# Patient Record
Sex: Male | Born: 1985 | Race: Black or African American | Hispanic: No | Marital: Married | Smoking: Never smoker
Health system: Southern US, Community
[De-identification: ages and names within clinical notes are randomized; demographics above are authoritative.]

## PROBLEM LIST (undated history)

## (undated) DIAGNOSIS — K219 Gastro-esophageal reflux disease without esophagitis: Secondary | ICD-10-CM

## (undated) DIAGNOSIS — F329 Major depressive disorder, single episode, unspecified: Secondary | ICD-10-CM

## (undated) DIAGNOSIS — M1990 Unspecified osteoarthritis, unspecified site: Secondary | ICD-10-CM

## (undated) DIAGNOSIS — F32A Depression, unspecified: Secondary | ICD-10-CM

## (undated) DIAGNOSIS — F419 Anxiety disorder, unspecified: Secondary | ICD-10-CM

## (undated) DIAGNOSIS — T7840XA Allergy, unspecified, initial encounter: Secondary | ICD-10-CM

## (undated) DIAGNOSIS — R195 Other fecal abnormalities: Secondary | ICD-10-CM

## (undated) HISTORY — DX: Gastro-esophageal reflux disease without esophagitis: K21.9

## (undated) HISTORY — PX: UPPER GASTROINTESTINAL ENDOSCOPY: SHX188

## (undated) HISTORY — DX: Other fecal abnormalities: R19.5

## (undated) HISTORY — DX: Depression, unspecified: F32.A

## (undated) HISTORY — PX: COLONOSCOPY: SHX174

## (undated) HISTORY — DX: Anxiety disorder, unspecified: F41.9

## (undated) HISTORY — DX: Unspecified osteoarthritis, unspecified site: M19.90

## (undated) HISTORY — PX: OTHER SURGICAL HISTORY: SHX169

## (undated) HISTORY — PX: WISDOM TOOTH EXTRACTION: SHX21

## (undated) HISTORY — DX: Allergy, unspecified, initial encounter: T78.40XA

---
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
## 2019-09-02 ENCOUNTER — Encounter (HOSPITAL_COMMUNITY): Payer: Self-pay

## 2019-09-02 ENCOUNTER — Emergency Department (HOSPITAL_COMMUNITY)
Admission: EM | Admit: 2019-09-02 | Discharge: 2019-09-03 | Discharge status: Against Medical Advice | Attending: Emergency Medicine | Admitting: Emergency Medicine

## 2019-09-02 DIAGNOSIS — K921 Melena: Secondary | ICD-10-CM | POA: Diagnosis present

## 2019-09-02 DIAGNOSIS — Z5321 Procedure and treatment not carried out due to patient leaving prior to being seen by health care provider: Secondary | ICD-10-CM | POA: Insufficient documentation

## 2019-09-02 LAB — CBC
HCT: 42.4 % (ref 39.0–52.0)
Hemoglobin: 15 g/dL (ref 13.0–17.0)
MCH: 31.3 pg (ref 26.0–34.0)
MCHC: 35.4 g/dL (ref 30.0–36.0)
MCV: 88.5 fL (ref 80.0–100.0)
Platelets: 214 10*3/uL (ref 150–400)
RBC: 4.79 MIL/uL (ref 4.22–5.81)
RDW: 12.5 % (ref 11.5–15.5)
WBC: 10.4 10*3/uL (ref 4.0–10.5)
nRBC: 0 % (ref 0.0–0.2)

## 2019-09-02 LAB — COMPREHENSIVE METABOLIC PANEL
ALT: 33 U/L (ref 0–44)
AST: 19 U/L (ref 15–41)
Albumin: 3.9 g/dL (ref 3.5–5.0)
Alkaline Phosphatase: 56 U/L (ref 38–126)
Anion gap: 11 (ref 5–15)
BUN: 25 mg/dL — ABNORMAL HIGH (ref 6–20)
CO2: 23 mmol/L (ref 22–32)
Calcium: 9.2 mg/dL (ref 8.9–10.3)
Chloride: 103 mmol/L (ref 98–111)
Creatinine, Ser: 1.31 mg/dL — ABNORMAL HIGH (ref 0.61–1.24)
GFR calc Af Amer: >60 mL/min (ref >60)
GFR calc non Af Amer: >60 mL/min (ref >60)
Glucose, Bld: 147 mg/dL — ABNORMAL HIGH (ref 70–99)
Potassium: 3.9 mmol/L (ref 3.5–5.1)
Sodium: 137 mmol/L (ref 135–145)
Total Bilirubin: 0.6 mg/dL (ref 0.3–1.2)
Total Protein: 6.3 g/dL — ABNORMAL LOW (ref 6.5–8.1)

## 2019-09-02 LAB — TYPE AND SCREEN
ABO/RH(D): O POS
Antibody Screen: NEGATIVE

## 2019-09-02 NOTE — ED Triage Notes (Signed)
Pt states that he has been having black stool for the past few days, denies abd pain.

## 2019-09-03 ENCOUNTER — Ambulatory Visit (INDEPENDENT_AMBULATORY_CARE_PROVIDER_SITE_OTHER)
Admission: EM | Admit: 2019-09-03 | Discharge: 2019-09-03 | Disposition: A | Discharge status: Home / Self Care | Source: Home / Self Care

## 2019-09-03 ENCOUNTER — Other Ambulatory Visit: Payer: Self-pay

## 2019-09-03 ENCOUNTER — Encounter (HOSPITAL_COMMUNITY): Payer: Self-pay | Admitting: Emergency Medicine

## 2019-09-03 DIAGNOSIS — R195 Other fecal abnormalities: Secondary | ICD-10-CM | POA: Diagnosis not present

## 2019-09-03 DIAGNOSIS — R1013 Epigastric pain: Secondary | ICD-10-CM

## 2019-09-03 LAB — POC OCCULT BLOOD, ED: Fecal Occult Bld: POSITIVE — AB

## 2019-09-03 LAB — ABO/RH: ABO/RH(D): O POS

## 2019-09-03 LAB — OCCULT BLOOD, POC DEVICE: Fecal Occult Bld: POSITIVE — AB

## 2019-09-03 MED ORDER — OMEPRAZOLE 20 MG PO CPDR: 20.0000 mg | DELAYED_RELEASE_CAPSULE | Freq: Two times a day (BID) | ORAL | 0 refills | Status: DC

## 2019-09-03 NOTE — ED Triage Notes (Signed)
Pt here for balck stools onset 3 days associated w/abd pain, decreased appetite.   Also c/o headache onset 1 week ago  Denies fevers, blood in stool , diarrhea  Saw PCP yesterday for headache and was given Meloxicam  A&O x4... nAD.Marland Kitchen. ambulatory

## 2019-09-03 NOTE — ED Notes (Signed)
No response in waiting area. 

## 2019-09-03 NOTE — Discharge Instructions (Signed)
Your stool was positive for blood.  Based on that and your upper abdominal discomfort there is some concern today for peptic ulcer.  We will start you on omeprazole 20 mg twice a day and have you follow-up with GI specialist for possible upper endoscopy.  If your symptoms worsen or you start vomiting blood you will need to go straight to the hospital. Otherwise follow-up with GI.  You will need a primary care. Please avoid all NSAIDs to include ibuprofen, meloxicam, Aleve Please avoid alcohol for now this could be irritating to the stomach You can take Tylenol for your headaches Eat small meals and avoid things that are spicy, greasy.  Try to decrease your caffeine

## 2019-09-03 NOTE — ED Provider Notes (Signed)
[facility]    CSN: [id number] Arrival date & time: 09/03/19  1136      History   Chief Complaint Chief Complaint  Patient presents with  . Melena    HPI Joe Mcgee is a 33 y.o. male.   Patient is a 33 year old male the presents today with 3 days of black stools associated with some upper abdominal discomfort and decreased appetite.  Symptoms have been constant, waxing and  waning.  Rating pain is a 4/10 now.  Describes the pain as gnawing and burning at times.  He has not taken anything for his symptoms.  He is also reporting headache for the past week and was prescribed meloxicam for this which he is not taking.  He has associated the headache with not eating.  Denies any fever, chills, body aches, diarrhea, constipation or rectal pain.  Denies any bright red rectal bleeding or hematemesis.  Admits to drinking liquor almost daily.  He has not had any increase in NSAID use.  He does not smoke.   Side note patient was seen in the ER yesterday and left due to wait time.  He had labs drawn at that time.  ROS per HPI      History reviewed. No pertinent past medical history.  There are no problems to display for this patient.   History reviewed. No pertinent surgical history.     Home Medications    Prior to Admission medications   Medication Sig Start Date End Date Taking? Authorizing Provider  omeprazole (PRILOSEC) 20 MG capsule Take 1 capsule (20 mg total) by mouth 2 (two) times daily before a meal. 09/03/19 10/03/19  Janace Aris, NP    Family History History reviewed. No pertinent family history.  Social History Social History   Tobacco Use  . Smoking status: Never Smoker  . Smokeless tobacco: Never Used  Substance Use Topics  . Alcohol use: Yes  . Drug use: Never     Allergies   Patient has no known allergies.   Review of Systems Review of Systems   Physical Exam Triage Vital Signs ED Triage Vitals  Enc Vitals Group   BP 09/03/19 1153 114/75     Pulse Rate 09/03/19 1153 (!) 102     Resp 09/03/19 1153 18     Temp 09/03/19 1153 98.8 F (37.1 C)     Temp Source 09/03/19 1153 Oral     SpO2 09/03/19 1153 96 %     Weight --      Height --      Head Circumference --      Peak Flow --      Pain Score 09/03/19 1154 4     Pain Loc --      Pain Edu? --      Excl. in GC? --    No data found.  Updated Vital Signs BP 114/75 (BP Location: Right Arm)   Pulse (!) 102   Temp 98.8 F (37.1 C) (Oral)   Resp 18   SpO2 96%   Visual Acuity Right Eye Distance:   Left Eye Distance:   Bilateral Distance:    Right Eye Near:   Left Eye Near:    Bilateral Near:     Physical Exam Vitals and nursing note reviewed.  Constitutional:      General: He is not in acute distress.    Appearance: Normal appearance. He is not ill-appearing, toxic-appearing or diaphoretic.  HENT:     Head:  Normocephalic and atraumatic.     Nose: Nose normal.  Eyes:     Conjunctiva/sclera: Conjunctivae normal.  Abdominal:     General: Abdomen is flat. Bowel sounds are normal.     Palpations: Abdomen is soft.     Tenderness: There is abdominal tenderness in the epigastric area. There is no right CVA tenderness, left CVA tenderness, guarding or rebound.    Musculoskeletal:        General: Normal range of motion.  Skin:    General: Skin is warm and dry.  Neurological:     Mental Status: He is alert.  Psychiatric:        Mood and Affect: Mood normal.      UC Treatments / Results  Labs (all labs ordered are listed, but only abnormal results are displayed) Labs Reviewed  POC OCCULT BLOOD, ED - Abnormal; Notable for the following components:      Result Value   Fecal Occult Bld POSITIVE (*)    All other components within normal limits  OCCULT BLOOD, POC DEVICE - Abnormal; Notable for the following components:   Fecal Occult Bld POSITIVE (*)    All other components within normal limits    EKG   Radiology No results  found.  Procedures Procedures (including critical care time)  Medications Ordered in UC Medications - No data to display  Initial Impression / Assessment and Plan / UC Course  I have reviewed the triage vital signs and the nursing notes.  Pertinent labs & imaging results that were available during my care of the patient were reviewed by me and considered in my medical decision making (see chart for details).     Epigastric discomfort with dark stools-based on symptoms, exam and positive Hemoccult most likely patient symptoms are related to PUD.  He is otherwise hemodynamically stable according to vital signs and normal hemoglobin that was drawn at the ER yesterday. He is not having any dizziness and pain is currently minimal. We will start him on PPI and have him follow-up with GI specialist Strict return precautions that if symptoms worsen he will need to go to the ER. Pt understanding and agreed.  Final Clinical Impressions(s) / UC Diagnoses   Final diagnoses:  Dark stools  Epigastric abdominal pain     Discharge Instructions     Your stool was positive for blood.  Based on that and your upper abdominal discomfort there is some concern today for peptic ulcer.  We will start you on omeprazole 20 mg twice a day and have you follow-up with GI specialist for possible upper endoscopy.  If your symptoms worsen or you start vomiting blood you will need to go straight to the hospital. Otherwise follow-up with GI.  You will need a primary care. Please avoid all NSAIDs to include ibuprofen, meloxicam, Aleve Please avoid alcohol for now this could be irritating to the stomach You can take Tylenol for your headaches Eat small meals and avoid things that are spicy, greasy.  Try to decrease your caffeine     ED Prescriptions    Medication Sig Dispense Auth. Provider   omeprazole (PRILOSEC) 20 MG capsule Take 1 capsule (20 mg total) by mouth 2 (two) times daily before a meal. 60 capsule  Syretta Kochel A, NP     PDMP not reviewed this encounter.   Orvan July, NP 09/03/19 1318

## 2019-09-03 NOTE — ED Notes (Signed)
Called to take to treatment room, no response, not found in lobby area.

## 2019-09-09 ENCOUNTER — Ambulatory Visit (INDEPENDENT_AMBULATORY_CARE_PROVIDER_SITE_OTHER): Admitting: Physician Assistant

## 2019-09-09 ENCOUNTER — Other Ambulatory Visit (INDEPENDENT_AMBULATORY_CARE_PROVIDER_SITE_OTHER)

## 2019-09-09 ENCOUNTER — Encounter: Payer: Self-pay | Admitting: Physician Assistant

## 2019-09-09 VITALS — BP 118/60 | HR 97 | Temp 97.4°F | Ht 72.0 in | Wt 222.0 lb

## 2019-09-09 DIAGNOSIS — K921 Melena: Secondary | ICD-10-CM

## 2019-09-09 DIAGNOSIS — K5900 Constipation, unspecified: Secondary | ICD-10-CM | POA: Diagnosis not present

## 2019-09-09 DIAGNOSIS — R140 Abdominal distension (gaseous): Secondary | ICD-10-CM

## 2019-09-09 DIAGNOSIS — R630 Anorexia: Secondary | ICD-10-CM | POA: Diagnosis not present

## 2019-09-09 DIAGNOSIS — R195 Other fecal abnormalities: Secondary | ICD-10-CM

## 2019-09-09 LAB — BASIC METABOLIC PANEL
BUN: 13 mg/dL (ref 6–23)
CO2: 29 mEq/L (ref 19–32)
Calcium: 9 mg/dL (ref 8.4–10.5)
Chloride: 102 mEq/L (ref 96–112)
Creatinine, Ser: 1.26 mg/dL (ref 0.40–1.50)
GFR: 79.84 mL/min (ref >60.00)
Glucose, Bld: 95 mg/dL (ref 70–99)
Potassium: 3.9 mEq/L (ref 3.5–5.1)
Sodium: 137 mEq/L (ref 135–145)

## 2019-09-09 MED ORDER — OMEPRAZOLE 20 MG PO CPDR: 20.0000 mg | DELAYED_RELEASE_CAPSULE | Freq: Two times a day (BID) | ORAL | 0 refills | Status: DC

## 2019-09-09 NOTE — Progress Notes (Signed)
Subjective:    Patient ID: Joe Mcgee, male    DOB: 01-28-1986, 33 y.o.   MRN: [id number]  HPI Joe Mcgee is a pleasant 33 year old African-American male, new to GI today referred after recent urgent care visit on 09/03/2019.  He presented there with complaints of black stools x3 days.  He also had a decrease in appetite recently and says he feels bloated and uncomfortable generally in his abdomen and has had some recent constipation. Stool was Hemoccult it there and positive.  He was started on omeprazole 20 mg p.o. twice daily.  Labs with CBC showed WBC of 10.4, hemoglobin 15 hematocrit of 42.4, BUN 25/creatinine 1.3 and LFTs within normal limits.  Patient denies any regular aspirin or NSAID use, he does drink occasionally a couple of times per week but not on a daily basis. He has not had any prior GI issues. He continues to complain of abdominal discomfort some bloating and fullness today.  He has not noted any further dark or black stools since Monday and denies any bright red blood.  He still feels somewhat constipated. No recent abdominal imaging Family history negative for GI disease as far as he is aware  Review of Systems Pertinent positive and negative review of systems were noted in the above HPI section.  All other review of systems was otherwise negative.  Outpatient Encounter Medications as of 09/09/2019  Medication Sig  . omeprazole (PRILOSEC) 20 MG capsule Take 1 capsule (20 mg total) by mouth 2 (two) times daily before a meal.  . [DISCONTINUED] omeprazole (PRILOSEC) 20 MG capsule Take 1 capsule (20 mg total) by mouth 2 (two) times daily before a meal.   No facility-administered encounter medications on file as of 09/09/2019.   No Known Allergies There are no problems to display for this patient.  Social History   Socioeconomic History  . Marital status: Married    Spouse name: Not on file  . Number of children: 4  . Years of education: Not on file  . Highest  education level: Not on file  Occupational History  . Occupation: [employer]  Tobacco Use  . Smoking status: Never Smoker  . Smokeless tobacco: Never Used  Substance and Sexual Activity  . Alcohol use: Yes    Comment: socially   . Drug use: Never  . Sexual activity: Not on file  Other Topics Concern  . Not on file  Social History Narrative  . Not on file   Social Determinants of Health   Financial Resource Strain:   . Difficulty of Paying Living Expenses: Not on file  Food Insecurity:   . Worried About Charity fundraiser in the Last Year: Not on file  . Ran Out of Food in the Last Year: Not on file  Transportation Needs:   . Lack of Transportation (Medical): Not on file  . Lack of Transportation (Non-Medical): Not on file  Physical Activity:   . Days of Exercise per Week: Not on file  . Minutes of Exercise per Session: Not on file  Stress:   . Feeling of Stress : Not on file  Social Connections:   . Frequency of Communication with Friends and Family: Not on file  . Frequency of Social Gatherings with Friends and Family: Not on file  . Attends Religious Services: Not on file  . Active Member of Clubs or Organizations: Not on file  . Attends Archivist Meetings: Not on file  . Marital Status: Not on file  Intimate Partner Violence:   . Fear of Current or Ex-Partner: Not on file  . Emotionally Abused: Not on file  . Physically Abused: Not on file  . Sexually Abused: Not on file    Joe Mcgee family history includes Hyperlipidemia in his father and mother; Hypertension in his father and mother.      Objective:    Vitals:   09/09/19 1443  BP: 118/60  Pulse: 97  Temp: (!) 97.4 F (36.3 C)    Physical Exam Well-developed well-nourished African-American male in no acute distress.   Weight 222, BMI 30.1  HEENT; nontraumatic normocephalic, EOMI, PER R LA, sclera anicteric. Oropharynx; not examined/mask/Covid Neck; supple, no JVD Cardiovascular; regular  rate and rhythm with S1-S2, no murmur rub or gallop Pulmonary; Clear bilaterally Abdomen; soft, he is tender in the right mid right lower quadrant no guarding no palpable mass or hepatosplenomegaly,, bowel sounds present bowel sounds are active Rectal; not done today recently documented heme positive Skin; benign exam, no jaundice rash or appreciable lesions Extremities; no clubbing cyanosis or edema skin warm and dry Neuro/Psych; alert and oriented x4, grossly nonfocal mood and affect appropriate       Assessment & Plan:   #17 33 year old African-American male with recent 3-day episode of black stool, documented heme positive at urgent care with normal hemoglobin at 15. Patient has been having abdominal discomfort/fullness over the past few weeks and is tender more in the right mid and right lower quadrant.  Also with recent constipation. Etiology of symptoms is not entirely clear, all of his symptoms do not fit well with gastritis or peptic ulcer disease. 2.  Mildly elevated creatinine  Plan; Continue omeprazole 20 mg p.o. twice daily Start MiraLAX 17 g in 8 ounces of water every day Push fluid intake. Patient will be scheduled for CT scan of the abdomen and pelvis with contrast within the next couple of days. If CT is unrevealing he will need Endoscopy with Dr. Marina Goodell.   [name] PA-C 09/09/2019   Cc: Janace Aris, NP

## 2019-09-09 NOTE — Patient Instructions (Signed)
We have sent the following medications to your pharmacy for you to pick up at your convenience: Omeprazole 20 mg twice a day   You have been scheduled for a CT scan of the abdomen and pelvis at [facility] ([address]---this is in the same building as [employer]).   You are scheduled on 09-15-19 at 10:30 am. You should arrive 15 minutes prior to your appointment time for registration. Please follow the written instructions below on the day of your exam:  WARNING: IF YOU ARE ALLERGIC TO IODINE/X-RAY DYE, PLEASE NOTIFY RADIOLOGY IMMEDIATELY AT [phone]! YOU WILL BE GIVEN A 13 HOUR PREMEDICATION PREP.  1) Do not eat or drink anything after 6:30 am (4 hours prior to your test) 2) You have been given 2 bottles of oral contrast to drink. The solution may taste better if refrigerated, but do NOT add ice or any other liquid to this solution. Shake well before drinking.    Drink 1 bottle of contrast @ 8:30 am (2 hours prior to your exam)  Drink 1 bottle of contrast @ 9:30 am (1 hour prior to your exam)  You may take any medications as prescribed with a small amount of water, if necessary. If you take any of the following medications: METFORMIN, GLUCOPHAGE, GLUCOVANCE, AVANDAMET, RIOMET, FORTAMET, [place] MET, JANUMET, GLUMETZA or METAGLIP, you MAY be asked to HOLD this medication 48 hours AFTER the exam.  The purpose of you drinking the oral contrast is to aid in the visualization of your intestinal tract. The contrast solution may cause some diarrhea. Depending on your individual set of symptoms, you may also receive an intravenous injection of x-ray contrast/dye. Plan on being at [facility] for 30 minutes or longer, depending on the type of exam you are having performed.  This test typically takes 30-45 minutes to complete.  If you have any questions regarding your exam or if you need to reschedule, you may call the CT department at [phone] between the  hours of 8:00 am and 5:00 pm, Monday-Friday.  ________________________________________________________________________

## 2019-09-10 NOTE — Progress Notes (Signed)
Assessment and plan reviewed 

## 2019-09-13 NOTE — Progress Notes (Signed)
Please let pt know repeat labs show kidney function normal

## 2019-09-15 ENCOUNTER — Other Ambulatory Visit: Payer: Self-pay

## 2019-09-15 ENCOUNTER — Ambulatory Visit (INDEPENDENT_AMBULATORY_CARE_PROVIDER_SITE_OTHER)
Admission: RE | Admit: 2019-09-15 | Discharge: 2019-09-15 | Disposition: A | Discharge status: Home / Self Care | Source: Ambulatory Visit | Attending: Physician Assistant | Admitting: Physician Assistant

## 2019-09-15 DIAGNOSIS — R140 Abdominal distension (gaseous): Secondary | ICD-10-CM

## 2019-09-15 DIAGNOSIS — K921 Melena: Secondary | ICD-10-CM

## 2019-09-15 MED ORDER — IOHEXOL 300 MG/ML  SOLN
100.0000 mL | Freq: Once | INTRAMUSCULAR | Status: AC | PRN
  Administered 2019-09-15: 100 mL via INTRAVENOUS

## 2019-09-21 NOTE — Progress Notes (Signed)
Yes , ok to proceed with EGD

## 2019-10-11 ENCOUNTER — Encounter

## 2019-10-11 ENCOUNTER — Other Ambulatory Visit: Payer: Self-pay

## 2019-10-11 ENCOUNTER — Ambulatory Visit (AMBULATORY_SURGERY_CENTER): Admitting: *Deleted

## 2019-10-11 VITALS — Ht 72.0 in | Wt 217.0 lb

## 2019-10-11 DIAGNOSIS — R195 Other fecal abnormalities: Secondary | ICD-10-CM

## 2019-10-11 DIAGNOSIS — R140 Abdominal distension (gaseous): Secondary | ICD-10-CM

## 2019-10-11 DIAGNOSIS — Z01818 Encounter for other preprocedural examination: Secondary | ICD-10-CM

## 2019-10-11 NOTE — Progress Notes (Signed)
No egg or soy allergy known to patient  No issues with past sedation with any surgeries  or procedures, no intubation problems  No diet pills per patient No home 02 use per patient  No blood thinners per patient  Pt denies issues with constipation  No A fib or A flutter  EMMI video sent to pt's e mail     Due to the COVID-19 pandemic we are asking patients to follow these guidelines. Please only bring one care partner. Please be aware that your care partner may wait in the car in the parking lot or if they feel like they will be too hot to wait in the car, they may wait in the lobby on the 4th floor. All care partners are required to wear a mask the entire time (we do not have any that we can provide them), they need to practice social distancing, and we will do a Covid check for all patient's and care partners when you arrive. Also we will check their temperature and your temperature. If the care partner waits in their car they need to stay in the parking lot the entire time and we will call them on their cell phone when the patient is ready for discharge so they can bring the car to the front of the building. Also all patient's will need to wear a mask into building.  Pt verified name, DOB, address and insurance during PV today. Pt e-mailed instruction packet , Emmi video, and reasons to delay/cancel . PV completed over the phone. Pt encouraged to call with questions or issues - pt aware he wil;l sign the consent form and the pre procedure acknow. Form Friday 1-22 in admitting-

## 2019-10-12 ENCOUNTER — Ambulatory Visit (INDEPENDENT_AMBULATORY_CARE_PROVIDER_SITE_OTHER)

## 2019-10-12 DIAGNOSIS — Z1159 Encounter for screening for other viral diseases: Secondary | ICD-10-CM

## 2019-10-13 LAB — SARS CORONAVIRUS 2 (TAT 6-24 HRS): SARS Coronavirus 2: NEGATIVE

## 2019-10-15 ENCOUNTER — Other Ambulatory Visit: Payer: Self-pay

## 2019-10-15 ENCOUNTER — Ambulatory Visit (AMBULATORY_SURGERY_CENTER): Admitting: Internal Medicine

## 2019-10-15 ENCOUNTER — Encounter: Payer: Self-pay | Admitting: Internal Medicine

## 2019-10-15 VITALS — BP 118/76 | HR 79 | Temp 98.6°F | Resp 18 | Ht 72.0 in | Wt 222.0 lb

## 2019-10-15 DIAGNOSIS — K222 Esophageal obstruction: Secondary | ICD-10-CM

## 2019-10-15 DIAGNOSIS — K5900 Constipation, unspecified: Secondary | ICD-10-CM

## 2019-10-15 DIAGNOSIS — K449 Diaphragmatic hernia without obstruction or gangrene: Secondary | ICD-10-CM

## 2019-10-15 DIAGNOSIS — K2100 Gastro-esophageal reflux disease with esophagitis, without bleeding: Secondary | ICD-10-CM

## 2019-10-15 DIAGNOSIS — R140 Abdominal distension (gaseous): Secondary | ICD-10-CM

## 2019-10-15 DIAGNOSIS — R195 Other fecal abnormalities: Secondary | ICD-10-CM

## 2019-10-15 MED ORDER — PANTOPRAZOLE SODIUM 40 MG PO TBEC: 40.0000 mg | DELAYED_RELEASE_TABLET | Freq: Every day | ORAL | 11 refills | Status: DC

## 2019-10-15 MED ORDER — SODIUM CHLORIDE 0.9 % IV SOLN: 500.0000 mL | Freq: Once | INTRAVENOUS | Status: DC

## 2019-10-15 NOTE — Op Note (Signed)
[facility] Patient Name: Joe Mcgee Procedure Date: 10/15/2019 3:18 PM MRN: [id number] Endoscopist: Wilhemina Bonito. Marina Goodell , MD Age: 34 Referring MD:  Date of Birth: 27-May-1986 Gender: Male Account #: [id number] Procedure:                Upper GI endoscopy Indications:              Heme positive stool, dark stools, bloating,                            abdominal pain Medicines:                Monitored Anesthesia Care Procedure:                Pre-Anesthesia Assessment:                           - Prior to the procedure, a History and Physical                            was performed, and patient medications and                            allergies were reviewed. The patient's tolerance of                            previous anesthesia was also reviewed. The risks                            and benefits of the procedure and the sedation                            options and risks were discussed with the patient.                            All questions were answered, and informed consent                            was obtained. Prior Anticoagulants: The patient has                            taken no previous anticoagulant or antiplatelet                            agents. ASA Grade Assessment: I - A normal, healthy                            patient. After reviewing the risks and benefits,                            the patient was deemed in satisfactory condition to                            undergo the procedure.  After obtaining informed consent, the endoscope was                            passed under direct vision. Throughout the                            procedure, the patient's blood pressure, pulse, and                            oxygen saturations were monitored continuously. The                            Endoscope was introduced through the mouth, and                            advanced to the second part of duodenum. The upper                  GI endoscopy was accomplished without difficulty.                            The patient tolerated the procedure well. Scope In: Scope Out: Findings:                 The esophagus revealed erosive esophagitis with                            ulceration and stricturing (35 cm from the                            incisors).                           The stomach was normal except for a moderate sized                            sliding hiatal hernia.                           The examined duodenum was normal.                           The cardia and gastric fundus were normal on                            retroflexion. Complications:            No immediate complications. Estimated Blood Loss:     Estimated blood loss: none. Impression:               1. GERD with erosive esophagitis                           2. Esophageal stricture                           3. Hiatal hernia  4. Otherwise normal EGD. Recommendation:           1. Prescribe pantoprazole 40 mg daily; #30; 11                            refills                           2. Reflux precautions                           3. Schedule colonoscopy in the [facility] "evaluate change                            in bowel habits, lower abdominal pain, heme                            positive stool". Wilhemina Bonito. Marina Goodell, MD 10/15/2019 3:40:10 PM This report has been signed electronically.

## 2019-10-15 NOTE — Patient Instructions (Signed)
YOU HAD AN ENDOSCOPIC PROCEDURE TODAY AT THE [facility]:   Refer to the procedure report that was given to you for any specific questions about what was found during the examination.  If the procedure report does not answer your questions, please call your gastroenterologist to clarify.  If you requested that your care partner not be given the details of your procedure findings, then the procedure report has been included in a sealed envelope for you to review at your convenience later.  YOU SHOULD EXPECT: Some feelings of bloating in the abdomen. Passage of more gas than usual.  Walking can help get rid of the air that was put into your GI tract during the procedure and reduce the bloating. If you had a lower endoscopy (such as a colonoscopy or flexible sigmoidoscopy) you may notice spotting of blood in your stool or on the toilet paper. If you underwent a bowel prep for your procedure, you may not have a normal bowel movement for a few days.  Please Note:  You might notice some irritation and congestion in your nose or some drainage.  This is from the oxygen used during your procedure.  There is no need for concern and it should clear up in a day or so.  SYMPTOMS TO REPORT IMMEDIATELY:   Following upper endoscopy (EGD)  Vomiting of blood or coffee ground material  New chest pain or pain under the shoulder blades  Painful or persistently difficult swallowing  New shortness of breath  Fever of 100F or higher  Black, tarry-looking stools  For urgent or emergent issues, a gastroenterologist can be reached at any hour by calling [phone].   DIET:  We do recommend a small meal at first, but then you may proceed to your regular diet.  Drink plenty of fluids but you should avoid alcoholic beverages for 24 hours.  ACTIVITY:  You should plan to take it easy for the rest of today and you should NOT DRIVE or use heavy machinery until tomorrow (because of the sedation medicines used  during the test).    FOLLOW UP: Our staff will call the number listed on your records 48-72 hours following your procedure to check on you and address any questions or concerns that you may have regarding the information given to you following your procedure. If we do not reach you, we will leave a message.  We will attempt to reach you two times.  During this call, we will ask if you have developed any symptoms of COVID 19. If you develop any symptoms (ie: fever, flu-like symptoms, shortness of breath, cough etc.) before then, please call [phone].  If you test positive for Covid 19 in the 2 weeks post procedure, please call and report this information to us.    If any biopsies were taken you will be contacted by phone or by letter within the next 1-3 weeks.  Please call us at [phone] if you have not heard about the biopsies in 3 weeks.    SIGNATURES/CONFIDENTIALITY: You and/or your care partner have signed paperwork which will be entered into your electronic medical record.  These signatures attest to the fact that that the information above on your After Visit Summary has been reviewed and is understood.  Full responsibility of the confidentiality of this discharge information lies with you and/or your care-partner. 

## 2019-10-15 NOTE — Progress Notes (Signed)
Temp JB VS DT  Pt's states no medical or surgical changes since previsit or office visit. 

## 2019-10-15 NOTE — Progress Notes (Signed)
PT taken to PACU. Monitors in place. VSS. Report given to RN. 

## 2019-10-19 ENCOUNTER — Telehealth: Payer: Self-pay

## 2019-10-19 NOTE — Telephone Encounter (Signed)
  Follow up Call-  Call back number 10/15/2019  Post procedure Call Back phone  # [phone]  Permission to leave phone message Yes     Patient questions:  Do you have a fever, pain , or abdominal swelling? No. Pain Score  0 *  Have you tolerated food without any problems? Yes.    Have you been able to return to your normal activities? Yes.    Do you have any questions about your discharge instructions: Diet   No. Medications  No. Follow up visit  No.  Do you have questions or concerns about your Care? No.  Actions: * If pain score is 4 or above: No action needed, pain <4.    1. Have you developed a fever since your procedure? No  2.   Have you had an respiratory symptoms (SOB or cough) since your procedure? No  3.   Have you tested positive for COVID 19 since your procedure? No  4.   Have you had any family members/close contacts diagnosed with the COVID 19 since your procedure?  No   If yes to any of these questions please route to Laverna Peace, RN and Jennye Boroughs, RN.

## 2019-10-26 ENCOUNTER — Other Ambulatory Visit: Payer: Self-pay

## 2019-10-26 ENCOUNTER — Ambulatory Visit (AMBULATORY_SURGERY_CENTER): Payer: Self-pay | Admitting: *Deleted

## 2019-10-26 VITALS — Temp 96.9°F | Ht 72.0 in | Wt 230.0 lb

## 2019-10-26 DIAGNOSIS — R195 Other fecal abnormalities: Secondary | ICD-10-CM

## 2019-10-26 DIAGNOSIS — R194 Change in bowel habit: Secondary | ICD-10-CM

## 2019-10-26 DIAGNOSIS — Z01818 Encounter for other preprocedural examination: Secondary | ICD-10-CM

## 2019-10-26 MED ORDER — NA SULFATE-K SULFATE-MG SULF 17.5-3.13-1.6 GM/177ML PO SOLN: ORAL | 0 refills | Status: DC

## 2019-10-26 NOTE — Progress Notes (Signed)
Patient is here in-person for PV. Patient denies any allergies to eggs or soy. Patient denies any problems with anesthesia/sedation. Patient denies any oxygen use at home. Patient denies taking any diet/weight loss medications or blood thinners. Patient is not being treated for MRSA or C-diff. EMMI education assisgned to the patient for the procedure, this was explained and instructions given to patient. COVID-19 screening test is on 2/11, the pt is aware. Pt is aware that care partner will wait in the car during procedure; if they feel like they will be too hot or cold to wait in the car; they may wait in the 4 th floor lobby. Patient is aware to bring only one care partner. We want them to wear a mask (we do not have any that we can provide them), practice social distancing, and we will check their temperatures when they get here.  I did remind the patient that their care partner needs to stay in the parking lot the entire time and have a cell phone available, we will call them when the pt is ready for discharge. Patient will wear mask into building.    Patient denies any changes in medical hx since last GI exam.

## 2019-11-04 ENCOUNTER — Other Ambulatory Visit: Payer: Self-pay | Admitting: Internal Medicine

## 2019-11-04 ENCOUNTER — Ambulatory Visit (INDEPENDENT_AMBULATORY_CARE_PROVIDER_SITE_OTHER)

## 2019-11-04 DIAGNOSIS — Z1159 Encounter for screening for other viral diseases: Secondary | ICD-10-CM

## 2019-11-05 LAB — SARS CORONAVIRUS 2 (TAT 6-24 HRS): SARS Coronavirus 2: NEGATIVE

## 2019-11-09 ENCOUNTER — Ambulatory Visit (AMBULATORY_SURGERY_CENTER): Admitting: Internal Medicine

## 2019-11-09 ENCOUNTER — Encounter: Payer: Self-pay | Admitting: Internal Medicine

## 2019-11-09 ENCOUNTER — Other Ambulatory Visit: Payer: Self-pay

## 2019-11-09 VITALS — BP 129/74 | HR 76 | Temp 96.8°F | Resp 14 | Ht 72.0 in | Wt 230.0 lb

## 2019-11-09 DIAGNOSIS — K5900 Constipation, unspecified: Secondary | ICD-10-CM

## 2019-11-09 DIAGNOSIS — R1084 Generalized abdominal pain: Secondary | ICD-10-CM | POA: Diagnosis not present

## 2019-11-09 DIAGNOSIS — R194 Change in bowel habit: Secondary | ICD-10-CM | POA: Diagnosis not present

## 2019-11-09 DIAGNOSIS — R195 Other fecal abnormalities: Secondary | ICD-10-CM | POA: Diagnosis not present

## 2019-11-09 DIAGNOSIS — R140 Abdominal distension (gaseous): Secondary | ICD-10-CM

## 2019-11-09 MED ORDER — SODIUM CHLORIDE 0.9 % IV SOLN: 500.0000 mL | Freq: Once | INTRAVENOUS | Status: DC

## 2019-11-09 NOTE — Patient Instructions (Signed)
YOU HAD AN ENDOSCOPIC PROCEDURE TODAY AT THE [facility]:   Refer to the procedure report that was given to you for any specific questions about what was found during the examination.  If the procedure report does not answer your questions, please call your gastroenterologist to clarify.  If you requested that your care partner not be given the details of your procedure findings, then the procedure report has been included in a sealed envelope for you to review at your convenience later.  YOU SHOULD EXPECT: Some feelings of bloating in the abdomen. Passage of more gas than usual.  Walking can help get rid of the air that was put into your GI tract during the procedure and reduce the bloating. If you had a lower endoscopy (such as a colonoscopy or flexible sigmoidoscopy) you may notice spotting of blood in your stool or on the toilet paper. If you underwent a bowel prep for your procedure, you may not have a normal bowel movement for a few days.  Please Note:  You might notice some irritation and congestion in your nose or some drainage.  This is from the oxygen used during your procedure.  There is no need for concern and it should clear up in a day or so.  SYMPTOMS TO REPORT IMMEDIATELY:   Following lower endoscopy (colonoscopy or flexible sigmoidoscopy):  Excessive amounts of blood in the stool  Significant tenderness or worsening of abdominal pains  Swelling of the abdomen that is new, acute  Fever of 100F or higher  For urgent or emergent issues, a gastroenterologist can be reached at any hour by calling [phone].   DIET:  We do recommend a small meal at first, but then you may proceed to your regular diet.  Drink plenty of fluids but you should avoid alcoholic beverages for 24 hours.  ACTIVITY:  You should plan to take it easy for the rest of today and you should NOT DRIVE or use heavy machinery until tomorrow (because of the sedation medicines used during the test).     FOLLOW UP: Our staff will call the number listed on your records 48-72 hours following your procedure to check on you and address any questions or concerns that you may have regarding the information given to you following your procedure. If we do not reach you, we will leave a message.  We will attempt to reach you two times.  During this call, we will ask if you have developed any symptoms of COVID 19. If you develop any symptoms (ie: fever, flu-like symptoms, shortness of breath, cough etc.) before then, please call [phone].  If you test positive for Covid 19 in the 2 weeks post procedure, please call and report this information to us.    If any biopsies were taken you will be contacted by phone or by letter within the next 1-3 weeks.  Please call us at [phone] if you have not heard about the biopsies in 3 weeks.    SIGNATURES/CONFIDENTIALITY: You and/or your care partner have signed paperwork which will be entered into your electronic medical record.  These signatures attest to the fact that that the information above on your After Visit Summary has been reviewed and is understood.  Full responsibility of the confidentiality of this discharge information lies with you and/or your care-partner. 

## 2019-11-09 NOTE — Progress Notes (Signed)
Pt's states no medical or surgical changes since previsit or office visit. 

## 2019-11-09 NOTE — Progress Notes (Signed)
JB- Temp Vitals-CW

## 2019-11-09 NOTE — Op Note (Signed)
[facility] Patient Name: Joe Mcgee Procedure Date: 11/09/2019 1:19 PM MRN: [id number] Endoscopist: Docia Chuck. Henrene Pastor , MD Age: 34 Referring MD:  Date of Birth: [birth year] Gender: Male Account #: [id number] Procedure:                Colonoscopy Indications:              Abdominal pain, Heme positive stool, Change in                            bowel habits Medicines:                Monitored Anesthesia Care Procedure:                Pre-Anesthesia Assessment:                           - Prior to the procedure, a History and Physical                            was performed, and patient medications and                            allergies were reviewed. The patient's tolerance of                            previous anesthesia was also reviewed. The risks                            and benefits of the procedure and the sedation                            options and risks were discussed with the patient.                            All questions were answered, and informed consent                            was obtained. Prior Anticoagulants: The patient has                            taken no previous anticoagulant or antiplatelet                            agents. ASA Grade Assessment: I - A normal, healthy                            patient. After reviewing the risks and benefits,                            the patient was deemed in satisfactory condition to                            undergo the procedure.  After obtaining informed consent, the colonoscope                            was passed under direct vision. Throughout the                            procedure, the patient's blood pressure, pulse, and                            oxygen saturations were monitored continuously. The                            Colonoscope was introduced through the anus and                            advanced to the the cecum, identified by    appendiceal orifice and ileocecal valve. The                            terminal ileum, ileocecal valve, appendiceal                            orifice, and rectum were photographed. The quality                            of the bowel preparation was excellent. The                            colonoscopy was performed without difficulty. The                            patient tolerated the procedure well. The bowel                            preparation used was SUPREP via split dose                            instruction. Scope In: 1:36:11 PM Scope Out: 1:45:00 PM Scope Withdrawal Time: 0 hours 7 minutes 11 seconds  Total Procedure Duration: 0 hours 8 minutes 49 seconds  Findings:                 The terminal ileum appeared normal.                           The entire examined colon appeared normal on direct                            and retroflexion views. Complications:            No immediate complications. Estimated blood loss:                            None. Estimated Blood Loss:     Estimated blood loss: none. Impression:               -  The examined portion of the ileum was normal.                           - The entire examined colon is normal on direct and                            retroflexion views.                           - No specimens collected. Recommendation:           - Repeat colonoscopy at age 78 for screening                            purposes.                           - Patient has a contact number available for                            emergencies. The signs and symptoms of potential                            delayed complications were discussed with the                            patient. Return to normal activities tomorrow.                            Written discharge instructions were provided to the                            patient.                           - Resume previous diet.                           - Continue present medications.                            - Continue pantoprazole daily for your acid reflux                           - Office follow-up with Dr. Marina Goodell in 1 year.                            Contact the office in the interim for any questions                            or problems [name] N. Marina Goodell, MD 11/09/2019 1:49:02 PM This report has been signed electronically.

## 2019-11-09 NOTE — Progress Notes (Signed)
Pt tolerated well. VSS. Awake and to recovery. 

## 2019-11-12 ENCOUNTER — Telehealth: Payer: Self-pay | Admitting: *Deleted

## 2019-11-12 NOTE — Telephone Encounter (Signed)
No answer for post procedure call back and unable to leave message. SM 

## 2020-09-07 ENCOUNTER — Ambulatory Visit (INDEPENDENT_AMBULATORY_CARE_PROVIDER_SITE_OTHER): Admitting: Orthopedic Surgery

## 2020-09-07 DIAGNOSIS — M6702 Short Achilles tendon (acquired), left ankle: Secondary | ICD-10-CM

## 2020-09-11 ENCOUNTER — Encounter: Payer: Self-pay | Admitting: Orthopedic Surgery

## 2020-09-11 NOTE — Progress Notes (Signed)
Office Visit Note   Patient: Joe Mcgee           Date of Birth: 21-Jul-1986           MRN: [id number] Visit Date: 09/07/2020              Requested by: [name], MD [address] [place],  [place] [address] PCP: [name], MD  Chief Complaint  Patient presents with  . Left Achilles Tendon - Pain      HPI: Patient is a 34 year old gentleman who presents with 1 year history of left Achilles tendinitis.  Patient has pain with start up in the morning or prolonged rest.  Patient states that he has tried physical therapy which has helped a little he is currently wearing crocs.  Assessment & Plan: Visit Diagnoses:  1. Short Achilles tendon (acquired), left ankle     Plan: Patient was given heel lifts to wear to unload the Achilles during the day he was given instructions and given demonstration of Achilles stretching recommended Voltaren gel 3 times a day to the Achilles tendon.  Follow-Up Instructions: Return in about 4 weeks (around 10/05/2020).   Ortho Exam  Patient is alert, oriented, no adenopathy, well-dressed, normal affect, normal respiratory effort. Examination patient has pain over the left Achilles at the musculotendinous junction.  Examination of the ankle there is no tenderness to palpation around the ankle he has a stable anterior drawer.  Patient has pain to palpation over the deltoid and anterior talofibular ligament.  He has dorsiflexion short of neutral with his knee extended.  Imaging: No results found. No images are attached to the encounter.  Labs: No results found for: HGBA1C, ESRSEDRATE, CRP, LABURIC, REPTSTATUS, GRAMSTAIN, CULT, LABORGA   Lab Results  Component Value Date   ALBUMIN 3.9 09/02/2019    No results found for: MG No results found for: VD25OH  No results found for: PREALBUMIN CBC EXTENDED Latest Ref Rng & Units 09/02/2019  WBC 4.0 - 10.5 K/uL 10.4  RBC 4.22 - 5.81 MIL/uL 4.79  HGB 13.0 - 17.0 g/dL 84.1  HCT 32.4  - 40.1 % 42.4  PLT 150 - 400 K/uL 214     There is no height or weight on file to calculate BMI.  Orders:  No orders of the defined types were placed in this encounter.  No orders of the defined types were placed in this encounter.    Procedures: No procedures performed  Clinical Data: No additional findings.  ROS:  All other systems negative, except as noted in the HPI. Review of Systems  Objective: Vital Signs: There were no vitals taken for this visit.  Specialty Comments:  No specialty comments available.  PMFS History: Patient Active Problem List   Diagnosis Date Noted  . Heme positive stool 09/09/2019   Past Medical History:  Diagnosis Date  . Allergy   . Anxiety   . Arthritis   . Depression   . GERD (gastroesophageal reflux disease)   . Heme + stool     Family History  Problem Relation Age of Onset  . Hypertension Mother   . Hyperlipidemia Mother   . Hypertension Father   . Hyperlipidemia Father   . Colon cancer Neg Hx   . Colon polyps Neg Hx   . Esophageal cancer Neg Hx   . Stomach cancer Neg Hx   . Rectal cancer Neg Hx     Past Surgical History:  Procedure Laterality Date  . keloid removal     .  WISDOM TOOTH EXTRACTION     Social History   Occupational History  . Occupation: [employer]  Tobacco Use  . Smoking status: Never Smoker  . Smokeless tobacco: Never Used  Vaping Use  . Vaping Use: Never used  Substance and Sexual Activity  . Alcohol use: Yes    Comment: socially   . Drug use: Never  . Sexual activity: Not on file

## 2020-10-12 ENCOUNTER — Ambulatory Visit: Admitting: Orthopedic Surgery

## 2020-10-16 ENCOUNTER — Ambulatory Visit: Admitting: Orthopedic Surgery

## 2020-11-12 IMAGING — CT CT ABD-PELV W/ CM
2 of 4 series · 16 of 46 positions shown, 18 images · IV contrast (OMNIPAQUE 300)
Comparison: None.

CLINICAL DATA: Diffuse abdominal pain for 2 weeks. Abdominal
distension. Melena.

EXAM:
CT ABDOMEN AND PELVIS WITH CONTRAST
TECHNIQUE: Multidetector CT imaging of the abdomen and pelvis was performed
using the standard protocol following bolus administration of
intravenous contrast.
CONTRAST:  100mL OMNIPAQUE IOHEXOL 300 MG/ML  SOLN

[Series 2: abd/pel w · axial · 0.72mm/px · z∈[-523,-73]mm · 13 of 98 slices shown, 15 images]
[im 4/98  soft-tissue]
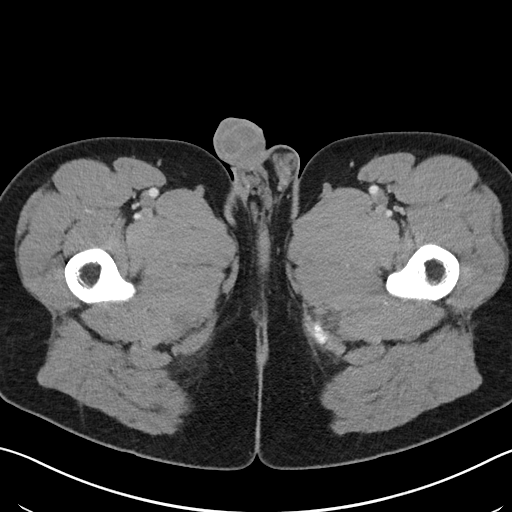
[im 4/98  bone]
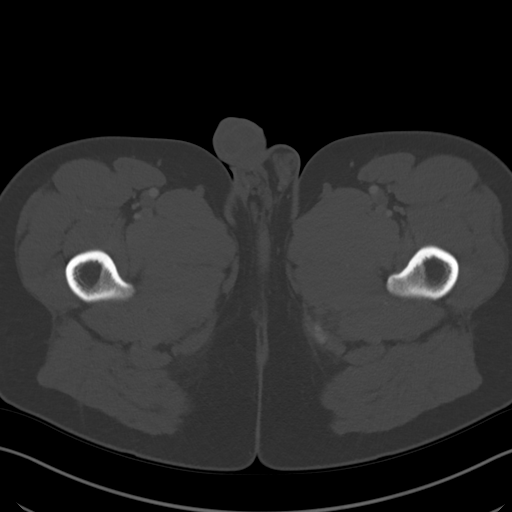
[im 12/98  soft-tissue]
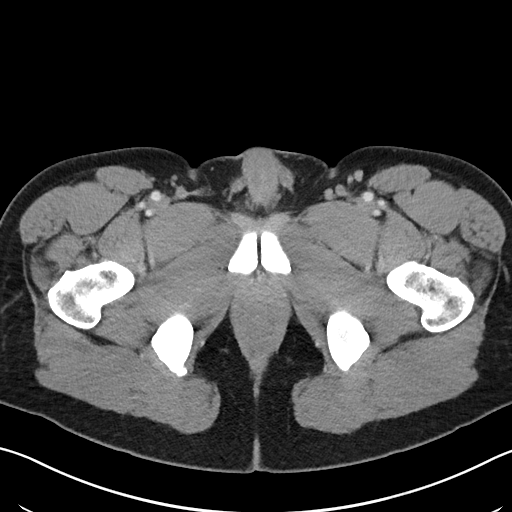
[im 19/98  soft-tissue]
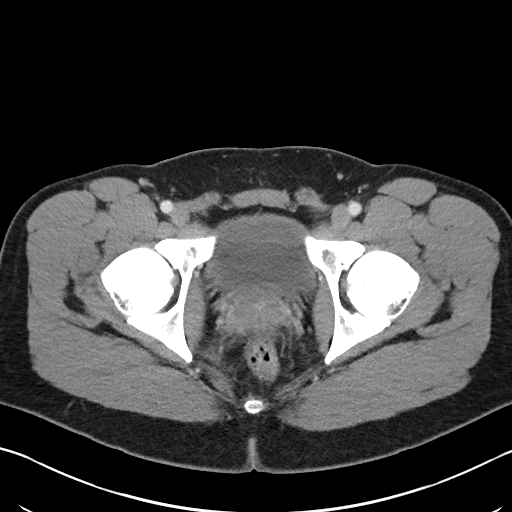
[im 27/98  soft-tissue]
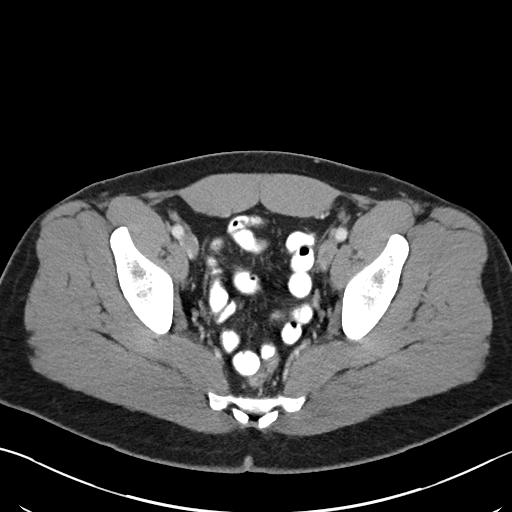
[im 34/98  soft-tissue]
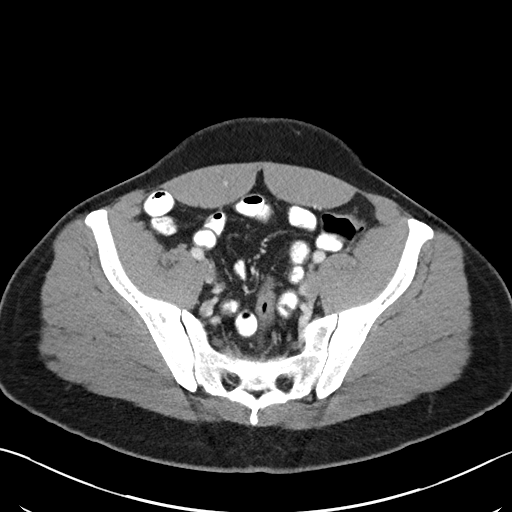
[im 42/98  soft-tissue]
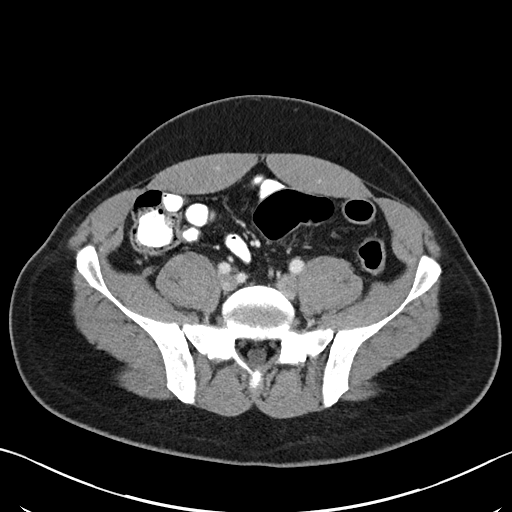
[im 49/98  soft-tissue]
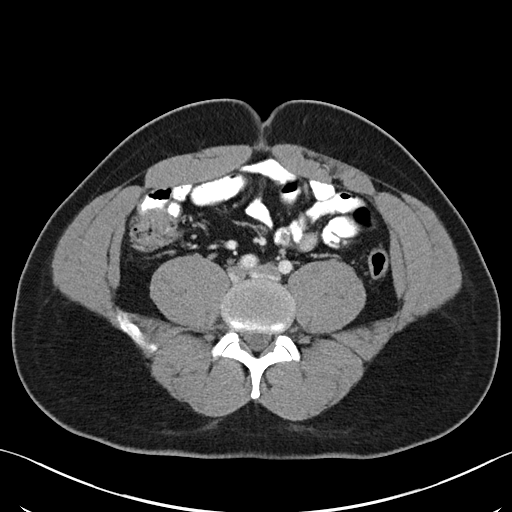
[im 56/98  soft-tissue]
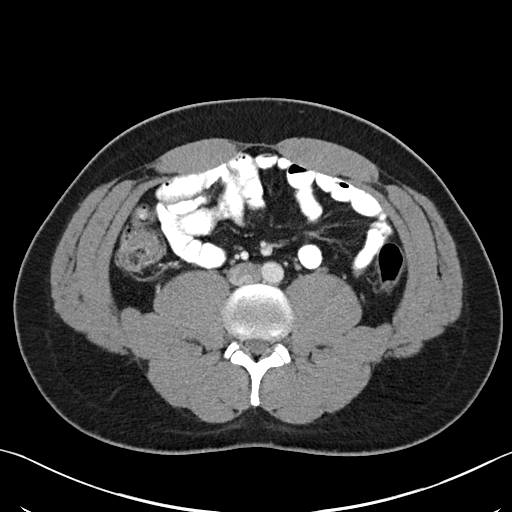
[im 64/98  soft-tissue]
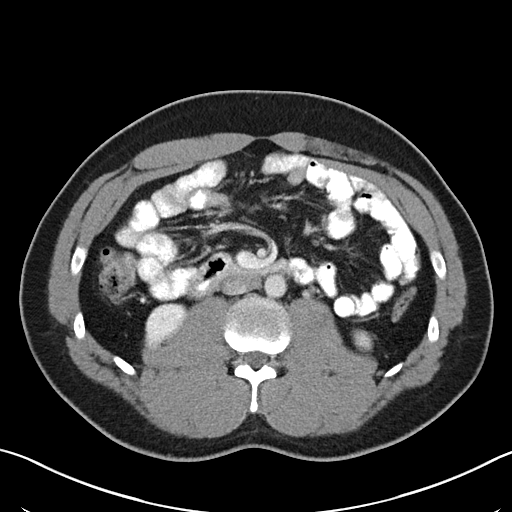
[im 64/98  bone]
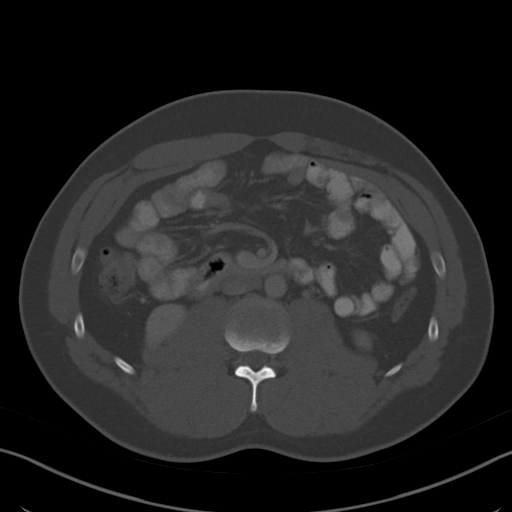
[im 71/98  soft-tissue]
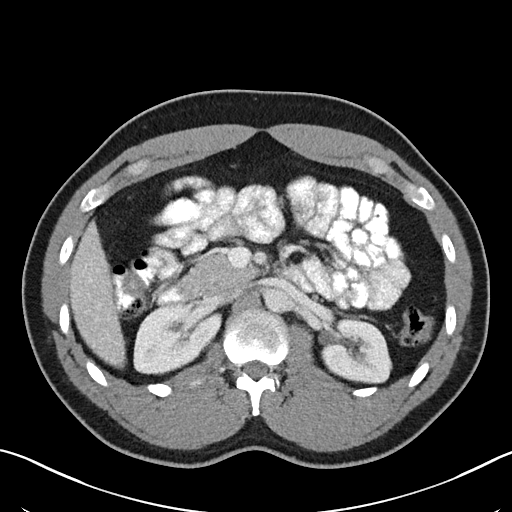
[im 79/98  soft-tissue]
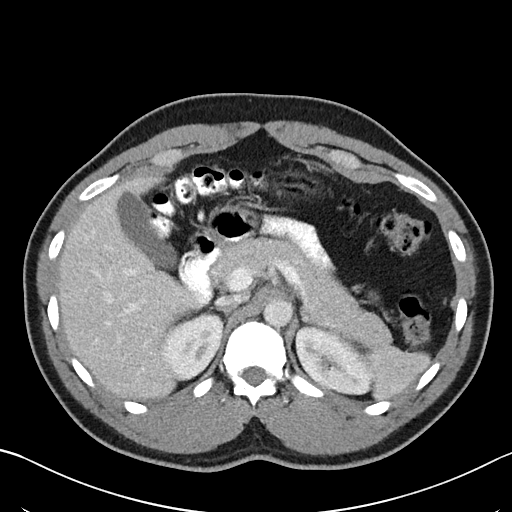
[im 86/98  soft-tissue]
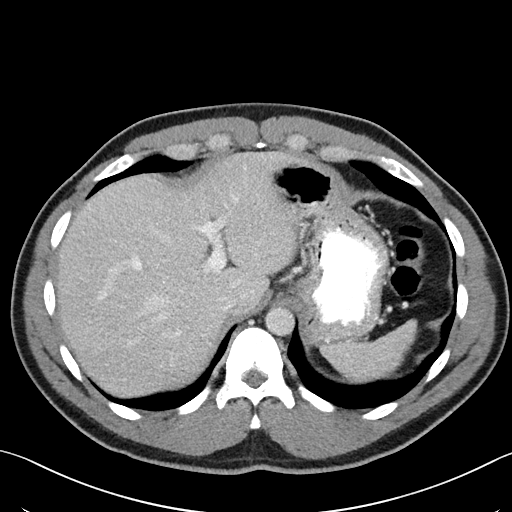
[im 94/98  soft-tissue]
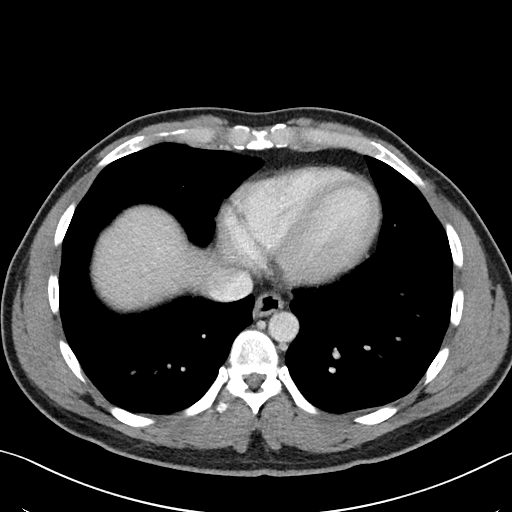

[Series 5: abd/pel w st · coronal · 0.72mm/px · 3 of 81 slices shown]
[im 27/81  soft-tissue]
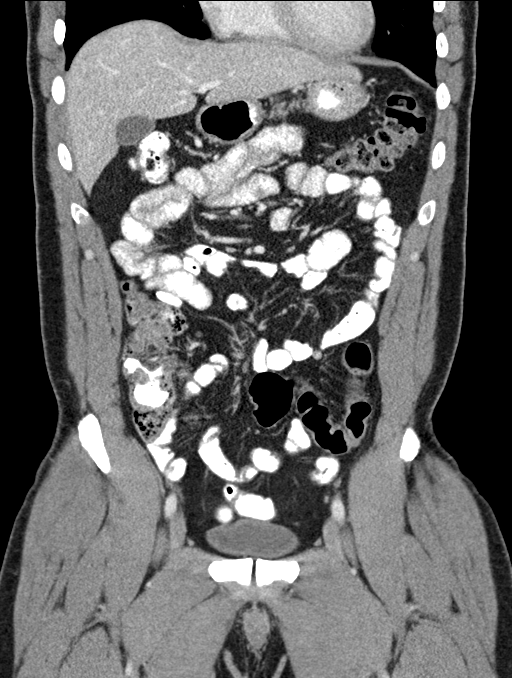
[im 36/81  soft-tissue]
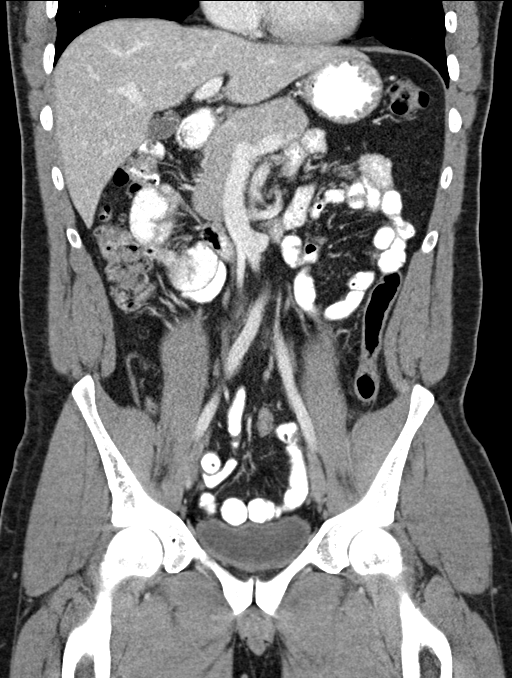
[im 45/81  soft-tissue]
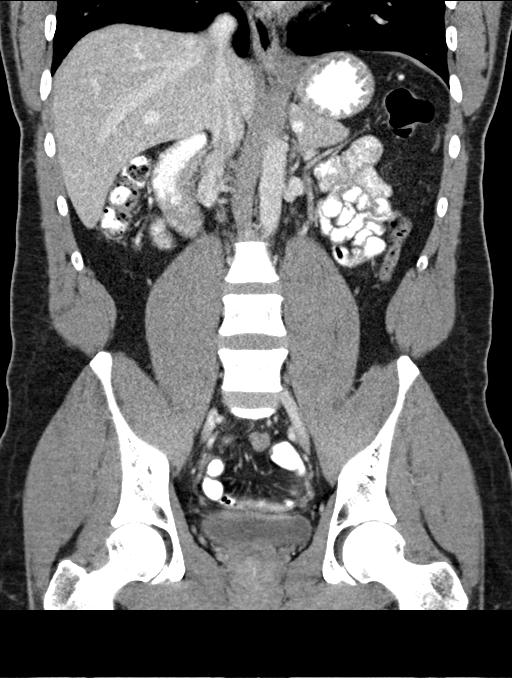

[16 of 46 positions shown; findings below may reference images not displayed]

FINDINGS: Lower Chest: No acute findings.

Hepatobiliary: No hepatic masses identified. Gallbladder is
unremarkable. No evidence of biliary ductal dilatation.

Pancreas:  No mass or inflammatory changes.

Spleen: Within normal limits in size and appearance.

Adrenals/Urinary Tract: No masses identified. No evidence of
hydronephrosis.

Stomach/Bowel: No evidence of obstruction, inflammatory process or
abnormal fluid collections. Normal appendix visualized.

Vascular/Lymphatic: No pathologically enlarged lymph nodes. No
abdominal aortic aneurysm.

Reproductive:  No mass or other significant abnormality.

Other:  None.

Musculoskeletal:  No suspicious bone lesions identified.
IMPRESSION: Negative. No acute findings or other significant abnormality.

## 2021-02-28 ENCOUNTER — Ambulatory Visit (INDEPENDENT_AMBULATORY_CARE_PROVIDER_SITE_OTHER): Admitting: Physician Assistant

## 2021-02-28 ENCOUNTER — Encounter: Payer: Self-pay | Admitting: Physician Assistant

## 2021-02-28 VITALS — BP 102/58 | HR 82 | Ht 72.0 in | Wt 220.2 lb

## 2021-02-28 DIAGNOSIS — K449 Diaphragmatic hernia without obstruction or gangrene: Secondary | ICD-10-CM | POA: Diagnosis not present

## 2021-02-28 DIAGNOSIS — K2100 Gastro-esophageal reflux disease with esophagitis, without bleeding: Secondary | ICD-10-CM | POA: Diagnosis not present

## 2021-02-28 DIAGNOSIS — K219 Gastro-esophageal reflux disease without esophagitis: Secondary | ICD-10-CM

## 2021-02-28 MED ORDER — PANTOPRAZOLE SODIUM 40 MG PO TBEC: 40.0000 mg | DELAYED_RELEASE_TABLET | Freq: Every day | ORAL | 11 refills | Status: DC

## 2021-02-28 NOTE — Progress Notes (Signed)
Noted  

## 2021-02-28 NOTE — Patient Instructions (Signed)
If you are age 34 or younger, your body mass index should be between 19-25. Your Body mass index is 29.87 kg/m. If this is out of the aformentioned range listed, please consider follow up with your Primary Care Provider.  __________________________________________________________  The [facility] providers would like to encourage you to use [facility] to communicate with providers for non-urgent requests or questions.  Due to long hold times on the telephone, sending your provider a message by [facility] may be a faster and more efficient way to get a response.  Please allow 48 business hours for a response.  Please remember that this is for non-urgent requests.   You have been scheduled for a Barium Esophogram at [facility] (1st floor of the hospital) on 06/16/2021 at 9:30 am. Please arrive 15 minutes prior to your appointment for registration. Make certain not to have anything to eat or drink 3 hours prior to your test. If you need to reschedule for any reason, please contact radiology at [phone] to do so. __________________________________________________________________ A barium swallow is an examination that concentrates on views of the esophagus. This tends to be a double contrast exam (barium and two liquids which, when combined, create a gas to distend the wall of the oesophagus) or single contrast (non-ionic iodine based). The study is usually tailored to your symptoms so a good history is essential. Attention is paid during the study to the form, structure and configuration of the esophagus, looking for functional disorders (such as aspiration, dysphagia, achalasia, motility and reflux) EXAMINATION You may be asked to change into a gown, depending on the type of swallow being performed. A radiologist and radiographer will perform the procedure. The radiologist will advise you of the type of contrast selected for your procedure and direct you during the exam. You will be asked to stand,  sit or lie in several different positions and to hold a small amount of fluid in your mouth before being asked to swallow while the imaging is performed .In some instances you may be asked to swallow barium coated marshmallows to assess the motility of a solid food bolus. The exam can be recorded as a digital or video fluoroscopy procedure. POST PROCEDURE It will take 1-2 days for the barium to pass through your system. To facilitate this, it is important, unless otherwise directed, to increase your fluids for the next 24-48hrs and to resume your normal diet.  This test typically takes about 30 minutes to perform. __________________________________________________________________________________  Continue Pantoprazole 40 mg 1 tablet before dinner.  Start Ant-reflux regimen and Anti-Reflux diet.  Follow up pending the results of your Barium Swallow.  Thank you for entrusting me with your care and choosing [facility].  Amy Esterwood, PA-C

## 2021-02-28 NOTE — Progress Notes (Signed)
Subjective:    Patient ID: Joe Mcgee, male    DOB: Jun 06, 1986, 35 y.o.   MRN: [id number]  HPI Joe Mcgee is a pleasant 35 year old African-American male, who comes in today for follow-up.  He is established with Dr. Marina Mcgee and was seen by myself in December 2020.  At that time he was having a recent episode of dark stool x3 days and associated abdominal bloating and discomfort.  He was heme positive, and therefore referred.  He underwent EGD and colonoscopy.  Colonoscopy in  Feb 2021 was a normal exam.  EGD Jan 2021 showed erosive esophagitis with early stricturing, and a moderate sized sliding hiatal hernia, no dilation done. Patient was placed on Protonix 40 mg p.o. every morning. He comes in today stating that he is still been having symptoms of acid reflux and will frequently have burning in his esophagus even a day later when he has eaten a salad if he is having to do planks etc. for exercise. He says if he eats very healthily and sticks to smoothies etc. he generally will not have as many symptoms but the more solid foods he eats then the more he will have burning and indigestion.  He admits that he is not very good about remembering to take the Protonix and sometimes will go several days without the Protonix.  He does feel that the Protonix helps his symptoms when he takes it.  He has no dysphagia or odynophagia, no current complaints of abdominal pain no changes in bowel habits. He says he really does not want to stay on medication for the rest of his life and is somewhat frustrated by persistence of symptoms.  Review of Systems Pertinent positive and negative review of systems were noted in the above HPI section.  All other review of systems was otherwise negative.  Outpatient Encounter Medications as of 02/28/2021  Medication Sig  . cyclobenzaprine (FLEXERIL) 10 MG tablet Take 1 tablet by mouth every 8 (eight) hours as needed.  . [DISCONTINUED] pantoprazole (PROTONIX) 40 MG tablet Take  1 tablet (40 mg total) by mouth daily.  . pantoprazole (PROTONIX) 40 MG tablet Take 1 tablet (40 mg total) by mouth daily before supper.  . [DISCONTINUED] Ascorbic Acid (VITAMIN C PO) Take by mouth. (Patient not taking: Reported on 02/28/2021)   No facility-administered encounter medications on file as of 02/28/2021.   No Known Allergies Patient Active Problem List   Diagnosis Date Noted  . Heme positive stool 09/09/2019   Social History   Socioeconomic History  . Marital status: Married    Spouse name: Not on file  . Number of children: 4  . Years of education: Not on file  . Highest education level: Not on file  Occupational History  . Occupation: army  Tobacco Use  . Smoking status: Never Smoker  . Smokeless tobacco: Never Used  Vaping Use  . Vaping Use: Never used  Substance and Sexual Activity  . Alcohol use: Yes    Comment: socially   . Drug use: Never  . Sexual activity: Not on file  Other Topics Concern  . Not on file  Social History Narrative  . Not on file   Social Determinants of Health   Financial Resource Strain: Not on file  Food Insecurity: Not on file  Transportation Needs: Not on file  Physical Activity: Not on file  Stress: Not on file  Social Connections: Not on file  Intimate Partner Violence: Not on file    Mr.  [name]'s family history includes Hyperlipidemia in his father and mother; Hypertension in his father and mother.      Objective:    Vitals:   02/28/21 1400  BP: (!) 102/58  Pulse: 82  SpO2: 96%    Physical Exam Well-developed well-nourished young AA male  in no acute distress.  Height, Weight,220  BMI 29.8  HEENT; nontraumatic normocephalic, EOMI, PE R LA, sclera anicteric.  Neuro/Psych; alert and oriented x4, grossly nonfocal mood and affect appropriate       Assessment & Plan:   #72 35 year old African-American male with chronic GERD and EGD January 2021 showing erosive esophagitis with early stricture, and a moderate  hiatal hernia.  Patient comes in with persistence of symptoms but has not been compliant with taking Protonix on a daily basis and says he often forgets.  Sometimes even when taking the medication regularly he will have heartburn particularly with exercise and/or after eating certain foods like salads etc.  Patient expresses desire to avoid having to take medication for acid reflux forever.  Asks about alternatives  #2 normal colonoscopy February 2021  Plan; we reviewed need for stricter antireflux regimen and diet.   Discussed esophageal anatomy and with hiatal hernia increased propensity for chronic reflux. He feels he may remember to take the Protonix better if it is taken before dinner, okay to switch Protonix to 40 mg AC dinner, have sent multiple refills. We also briefly discussed possibility of surgical correction of his hiatal hernia. Will check barium swallow, to assess size of hernia and for any evidence of stricture.  Depending on results can decide if appropriate to pursue further work-up with potential TIF per Dr. Barron Alvine versus surgical referral, both of which would likely require preoperative pH study .  Joe Mcgee S Kyreese Chio PA-C 02/28/2021   Cc: Joe Beams, MD

## 2021-03-16 ENCOUNTER — Ambulatory Visit (HOSPITAL_COMMUNITY): Admission: RE | Admit: 2021-03-16 | Source: Ambulatory Visit

## 2022-02-19 ENCOUNTER — Ambulatory Visit
Admission: EM | Admit: 2022-02-19 | Discharge: 2022-02-19 | Disposition: A | Discharge status: Home / Self Care | Attending: Emergency Medicine | Admitting: Emergency Medicine

## 2022-02-19 ENCOUNTER — Ambulatory Visit (INDEPENDENT_AMBULATORY_CARE_PROVIDER_SITE_OTHER)

## 2022-02-19 ENCOUNTER — Other Ambulatory Visit

## 2022-02-19 DIAGNOSIS — M79672 Pain in left foot: Secondary | ICD-10-CM

## 2022-02-19 DIAGNOSIS — S90122A Contusion of left lesser toe(s) without damage to nail, initial encounter: Secondary | ICD-10-CM

## 2022-02-19 NOTE — ED Triage Notes (Signed)
Patient presents to Urgent Care with complaints of left pinky toe injury x 2 days. He states he stubbed his toe. Unable to bend. Treating pain with tylenol.

## 2022-02-19 NOTE — ED Provider Notes (Signed)
Renaldo Fiddler    CSN: [id number] Arrival date & time: 02/19/22  0856      History   Chief Complaint Chief Complaint  Patient presents with   Toe Injury    HPI Joe Mcgee is a 36 y.o. male.  Patient presents with pain, swelling, bruising of his left fourth toe after he hit it on railing on 02/17/2022.  Treatment at home with Tylenol and methocarbamol; last taken yesterday.  No open wounds.  No numbness, weakness, paresthesias, or other symptoms.  The history is provided by the patient and medical records.   Past Medical History:  Diagnosis Date   Allergy    Anxiety    Arthritis    Depression    GERD (gastroesophageal reflux disease)    Heme + stool     Patient Active Problem List   Diagnosis Date Noted   Heme positive stool 09/09/2019    Past Surgical History:  Procedure Laterality Date   COLONOSCOPY     keloid removal      UPPER GASTROINTESTINAL ENDOSCOPY     WISDOM TOOTH EXTRACTION         Home Medications    Prior to Admission medications   Medication Sig Start Date End Date Taking? Authorizing Provider  cyclobenzaprine (FLEXERIL) 10 MG tablet Take 1 tablet by mouth every 8 (eight) hours as needed. 12/25/20   [provider]  pantoprazole (PROTONIX) 40 MG tablet Take 1 tablet (40 mg total) by mouth daily before supper. 02/28/21   Esterwood, Amy S, PA-C    Family History Family History  Problem Relation Age of Onset   Hypertension Mother    Hyperlipidemia Mother    Hypertension Father    Hyperlipidemia Father    Colon cancer Neg Hx    Colon polyps Neg Hx    Esophageal cancer Neg Hx    Stomach cancer Neg Hx    Rectal cancer Neg Hx    Pancreatic cancer Neg Hx     Social History Social History   Tobacco Use   Smoking status: Never   Smokeless tobacco: Never  Vaping Use   Vaping Use: Never used  Substance Use Topics   Alcohol use: Yes    Comment: socially    Drug use: Never     Allergies   Patient has no known  allergies.   Review of Systems Review of Systems  Constitutional:  Negative for chills and fever.  Gastrointestinal:  Negative for vomiting.  Musculoskeletal:  Positive for arthralgias, gait problem and joint swelling.  Skin:  Positive for color change. Negative for wound.  Neurological:  Negative for weakness and numbness.  All other systems reviewed and are negative.   Physical Exam Triage Vital Signs ED Triage Vitals  Enc Vitals Group     BP      Pulse      Resp      Temp      Temp src      SpO2      Weight      Height      Head Circumference      Peak Flow      Pain Score      Pain Loc      Pain Edu?      Excl. in [place]?    No data found.  Updated Vital Signs BP 127/82   Pulse 96   Temp 97.9 F (36.6 C)   Resp 18   SpO2 97%  Visual Acuity Right Eye Distance:   Left Eye Distance:   Bilateral Distance:    Right Eye Near:   Left Eye Near:    Bilateral Near:     Physical Exam Vitals and nursing note reviewed.  Constitutional:      General: He is not in acute distress.    Appearance: Normal appearance. He is well-developed. He is not ill-appearing.  HENT:     Mouth/Throat:     Mouth: Mucous membranes are moist.  Cardiovascular:     Rate and Rhythm: Normal rate and regular rhythm.  Pulmonary:     Effort: Pulmonary effort is normal. No respiratory distress.  Musculoskeletal:        General: Swelling and tenderness present. No deformity.     Cervical back: Neck supple.       Feet:  Skin:    General: Skin is warm and dry.     Capillary Refill: Capillary refill takes less than 2 seconds.     Findings: Bruising present. No erythema or lesion.  Neurological:     General: No focal deficit present.     Mental Status: He is alert and oriented to person, place, and time.     Sensory: No sensory deficit.     Motor: No weakness.     Gait: Gait normal.  Psychiatric:        Mood and Affect: Mood normal.        Behavior: Behavior normal.     UC  Treatments / Results  Labs (all labs ordered are listed, but only abnormal results are displayed) Labs Reviewed - No data to display  EKG   Radiology DG Foot Complete Left  Result Date: 02/19/2022 CLINICAL DATA:  Lateral left foot pain EXAM: LEFT FOOT - COMPLETE 3+ VIEW COMPARISON:  None Available. FINDINGS: No evidence of acute fracture. No dislocation. Joint spaces are maintained. No significant arthropathy. Mild soft tissue swelling at the lateral aspect of the distal forefoot. IMPRESSION: 1. No acute osseous abnormality, left foot. 2. Mild soft tissue swelling at the lateral aspect of the distal forefoot. Electronically Signed   By: Duanne Guess D.O.   On: 02/19/2022 09:19    Procedures Procedures (including critical care time)  Medications Ordered in UC Medications - No data to display  Initial Impression / Assessment and Plan / UC Course  I have reviewed the triage vital signs and the nursing notes.  Pertinent labs & imaging results that were available during my care of the patient were reviewed by me and considered in my medical decision making (see chart for details).    Left foot pain, Contusion of left 4th toe.  Xray negative for bony abnormality.  Discussed Tylenol or ibuprofen as needed for discomfort, rest, elevation, ice packs.  Instructed patient to follow up with an orthopedist if his symptoms are not improving.  Patient agrees to plan of care.   Final Clinical Impressions(s) / UC Diagnoses   Final diagnoses:  Left foot pain  Contusion of lesser toe of left foot without damage to nail, initial encounter     Discharge Instructions      Take Tylenol or ibuprofen as needed.  Rest and elevate your foot.  Apply ice packs 2-3 times a day for up to 15 minutes each.      Follow up with an orthopedist if your symptoms are not improving.        ED Prescriptions   None    PDMP not reviewed this encounter.  Mickie Bailate, Kazuki Ingle H, NP 02/19/22 [phone]

## 2022-02-19 NOTE — Discharge Instructions (Addendum)
Take Tylenol or ibuprofen as needed.  Rest and elevate your foot.  Apply ice packs 2-3 times a day for up to 15 minutes each.      Follow up with an orthopedist if your symptoms are not improving.

## 2022-04-08 ENCOUNTER — Other Ambulatory Visit: Payer: Self-pay | Admitting: Physician Assistant

## 2022-04-08 DIAGNOSIS — K2100 Gastro-esophageal reflux disease with esophagitis, without bleeding: Secondary | ICD-10-CM

## 2022-06-27 ENCOUNTER — Encounter: Payer: Self-pay | Admitting: Internal Medicine

## 2022-10-15 ENCOUNTER — Other Ambulatory Visit: Payer: Self-pay | Admitting: Sports Medicine

## 2022-10-15 DIAGNOSIS — M25571 Pain in right ankle and joints of right foot: Secondary | ICD-10-CM

## 2022-10-24 ENCOUNTER — Ambulatory Visit
Admission: RE | Admit: 2022-10-24 | Discharge: 2022-10-24 | Disposition: A | Discharge status: Home / Self Care | Source: Ambulatory Visit | Attending: Sports Medicine | Admitting: Sports Medicine

## 2022-10-24 DIAGNOSIS — M25571 Pain in right ankle and joints of right foot: Secondary | ICD-10-CM

## 2022-11-07 ENCOUNTER — Other Ambulatory Visit: Payer: Self-pay

## 2022-11-07 ENCOUNTER — Encounter: Payer: Self-pay | Admitting: Emergency Medicine

## 2022-11-07 ENCOUNTER — Ambulatory Visit
Admission: EM | Admit: 2022-11-07 | Discharge: 2022-11-07 | Disposition: A | Discharge status: Home / Self Care | Attending: Internal Medicine | Admitting: Internal Medicine

## 2022-11-07 DIAGNOSIS — R21 Rash and other nonspecific skin eruption: Secondary | ICD-10-CM | POA: Diagnosis not present

## 2022-11-07 MED ORDER — METHYLPREDNISOLONE ACETATE 80 MG/ML IJ SUSP
40.0000 mg | Freq: Once | INTRAMUSCULAR | Status: AC
  Administered 2022-11-07: 40 mg via INTRAMUSCULAR

## 2022-11-07 NOTE — ED Provider Notes (Addendum)
[facility]    CSN: [id number] Arrival date & time: 11/07/22  1550      History   Chief Complaint Chief Complaint  Patient presents with   Rash    HPI Joe Mcgee is a 37 y.o. male.   Patient presents with itchy rash to bilateral arms and right hip that started today.  Patient denies any known sick contacts or fevers.  Reports that he finished taking amoxicillin yesterday for strep throat.  Has previously taken penicillin before without a reaction.  Reports that he recently changed hand soaps and started taking a new supplement that was recommended by [employer].  Has taken Claritin today for symptoms of minimal improvement.  Denies feelings of throat closing or shortness of breath.   Rash   Past Medical History:  Diagnosis Date   Allergy    Anxiety    Arthritis    Depression    GERD (gastroesophageal reflux disease)    Heme + stool     Patient Active Problem List   Diagnosis Date Noted   Heme positive stool 09/09/2019    Past Surgical History:  Procedure Laterality Date   COLONOSCOPY     keloid removal      UPPER GASTROINTESTINAL ENDOSCOPY     WISDOM TOOTH EXTRACTION         Home Medications    Prior to Admission medications   Medication Sig Start Date End Date Taking? Authorizing Provider  cyclobenzaprine (FLEXERIL) 10 MG tablet Take 1 tablet by mouth every 8 (eight) hours as needed. Patient not taking: Reported on 11/07/2022 12/25/20   [provider]  pantoprazole (PROTONIX) 40 MG tablet TAKE 1 TABLET (40 MG TOTAL) BY MOUTH DAILY BEFORE SUPPER. 04/08/22   Esterwood, Amy S, PA-C    Family History Family History  Problem Relation Age of Onset   Hypertension Mother    Hyperlipidemia Mother    Hypertension Father    Hyperlipidemia Father    Colon cancer Neg Hx    Colon polyps Neg Hx    Esophageal cancer Neg Hx    Stomach cancer Neg Hx    Rectal cancer Neg Hx    Pancreatic cancer Neg Hx     Social History Social History    Tobacco Use   Smoking status: Never   Smokeless tobacco: Never  Vaping Use   Vaping Use: Never used  Substance Use Topics   Alcohol use: Yes    Comment: socially    Drug use: Never     Allergies   Patient has no known allergies.   Review of Systems Review of Systems Per HPI  Physical Exam Triage Vital Signs ED Triage Vitals  Enc Vitals Group     BP 11/07/22 1746 130/85     Pulse Rate 11/07/22 1746 82     Resp 11/07/22 1746 20     Temp 11/07/22 1746 98.8 F (37.1 C)     Temp Source 11/07/22 1746 Oral     SpO2 11/07/22 1746 96 %     Weight --      Height --      Head Circumference --      Peak Flow --      Pain Score 11/07/22 1745 0     Pain Loc --      Pain Edu? --      Excl. in [place]? --    No data found.  Updated Vital Signs BP 130/85 (BP Location: Left Arm) Comment (BP Location):  large cuff  Pulse 82   Temp 98.8 F (37.1 C) (Oral)   Resp 20   SpO2 96%   Visual Acuity Right Eye Distance:   Left Eye Distance:   Bilateral Distance:    Right Eye Near:   Left Eye Near:    Bilateral Near:     Physical Exam Constitutional:      General: He is not in acute distress.    Appearance: Normal appearance. He is not toxic-appearing or diaphoretic.  HENT:     Head: Normocephalic and atraumatic.     Mouth/Throat:     Comments: Tongue normal. Throat normal.  Eyes:     Extraocular Movements: Extraocular movements intact.     Conjunctiva/sclera: Conjunctivae normal.  Pulmonary:     Effort: Pulmonary effort is normal.  Skin:    Comments: Rash resembling hives present to bilateral forearms and hands as well as right hip.  Neurological:     General: No focal deficit present.     Mental Status: He is alert and oriented to person, place, and time. Mental status is at baseline.  Psychiatric:        Mood and Affect: Mood normal.        Behavior: Behavior normal.        Thought Content: Thought content normal.        Judgment: Judgment normal.      UC  Treatments / Results  Labs (all labs ordered are listed, but only abnormal results are displayed) Labs Reviewed - No data to display  EKG   Radiology No results found.  Procedures Procedures (including critical care time)  Medications Ordered in UC Medications  methylPREDNISolone acetate (DEPO-MEDROL) injection 40 mg (has no administration in time range)    Initial Impression / Assessment and Plan / UC Course  I have reviewed the triage vital signs and the nursing notes.  Pertinent labs & imaging results that were available during my care of the patient were reviewed by me and considered in my medical decision making (see chart for details).     Rash is consistent with allergic reaction.  There are no signs of anaphylaxis on exam so do not think that emergent evaluation or epinephrine administration is necessary.  Do not think that this is amoxicillin related as he has completed this medication and has taken before and tolerated well.  Suspect it is due to hand wash or a new supplement.  Advised him to stop using these 2 possible triggers.  Will treat with IM steroid.  Also advised patient to take antihistamine.  Advised patient to follow-up if any symptoms persist or worsen.  Patient verbalized understanding and was agreeable with plan. Final Clinical Impressions(s) / UC Diagnoses   Final diagnoses:  Rash and nonspecific skin eruption     Discharge Instructions      You are given a steroid injection today for your rash.  Please follow-up if any symptoms persist or worsen.    ED Prescriptions   None    PDMP not reviewed this encounter.   Teodora Medici, [place] 11/07/22 [place], [place], [place] 11/07/22 [phone]

## 2022-11-07 NOTE — ED Triage Notes (Signed)
Reports rash noticed today.  Involves both arms, right hip.  Reports itching.  Different size red bumps present  Patient finished amoxicillin yesterday for strep.  Patient has taken claritin

## 2022-11-07 NOTE — Discharge Instructions (Addendum)
You are given a steroid injection today for your rash.  Please follow-up if any symptoms persist or worsen.

## 2022-11-25 ENCOUNTER — Encounter: Payer: Self-pay | Admitting: Internal Medicine

## 2023-04-19 IMAGING — DX DG FOOT COMPLETE 3+V*L*
3 series · 3 of 3 positions shown · non-contrast
Comparison: None Available.

CLINICAL DATA: Lateral left foot pain

EXAM:
LEFT FOOT - COMPLETE 3+ VIEW

[foot ap]
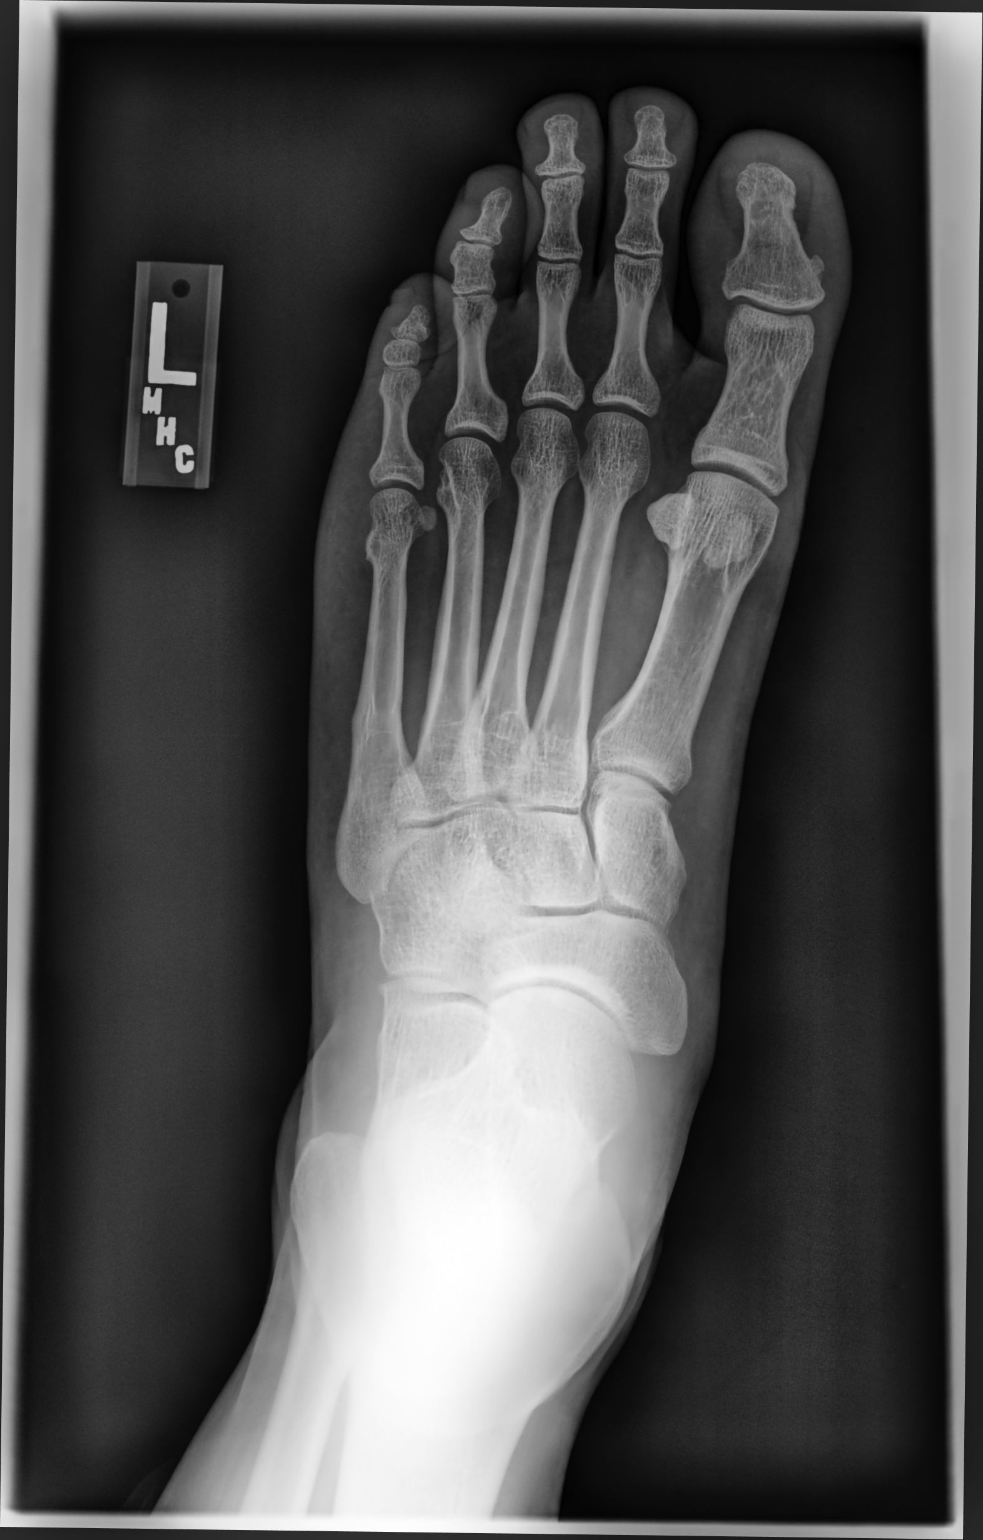

[foot mlo]
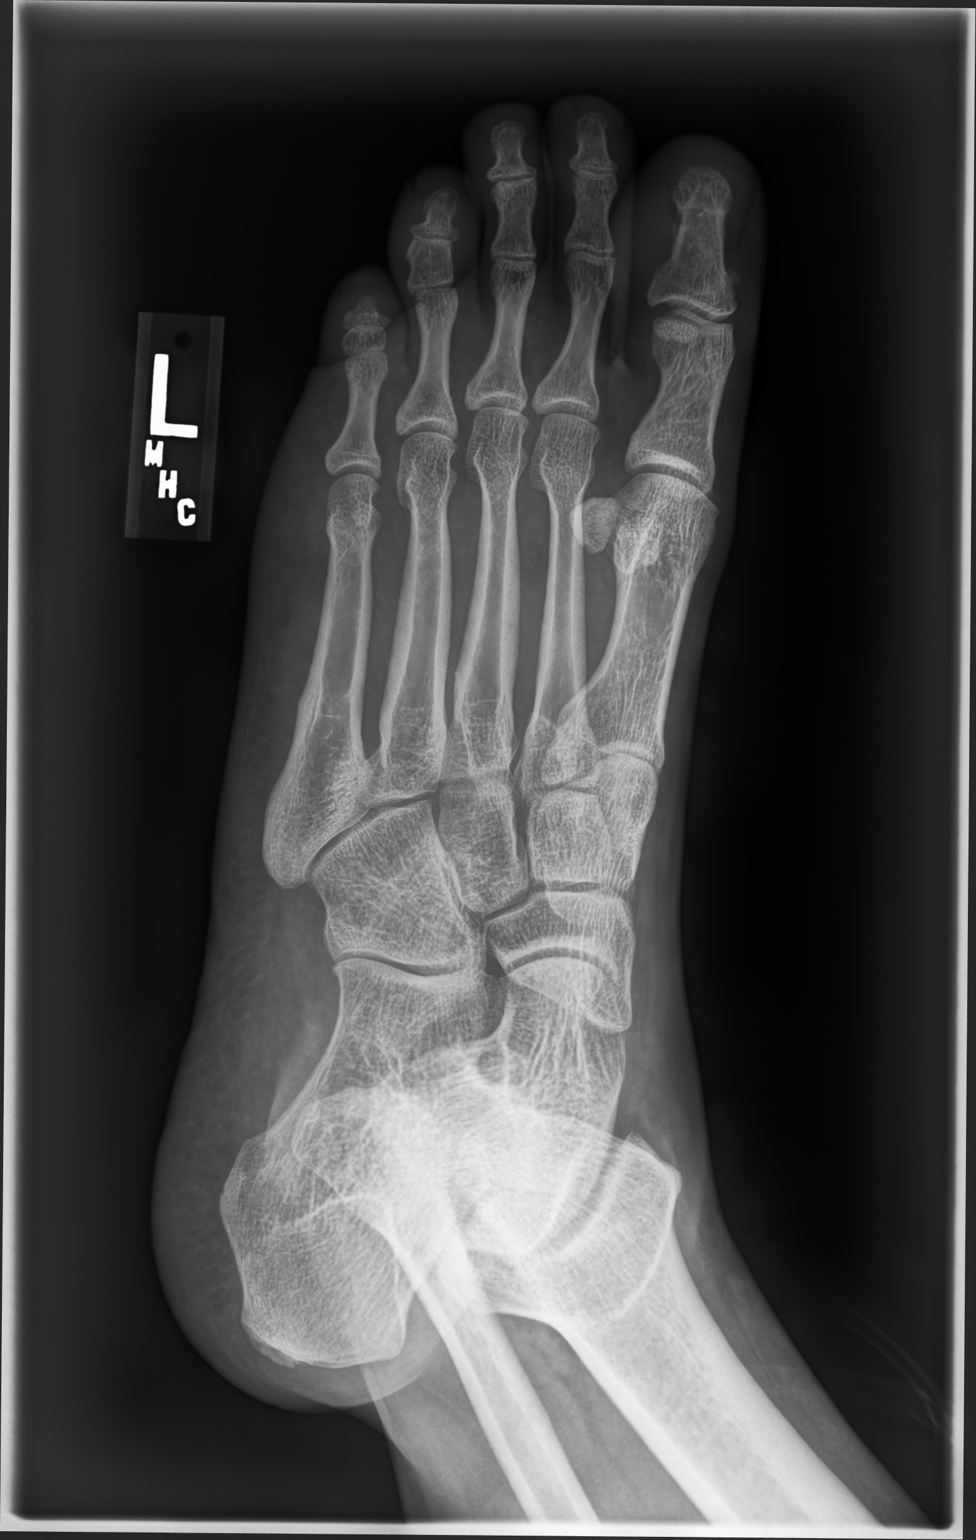

[foot lat]
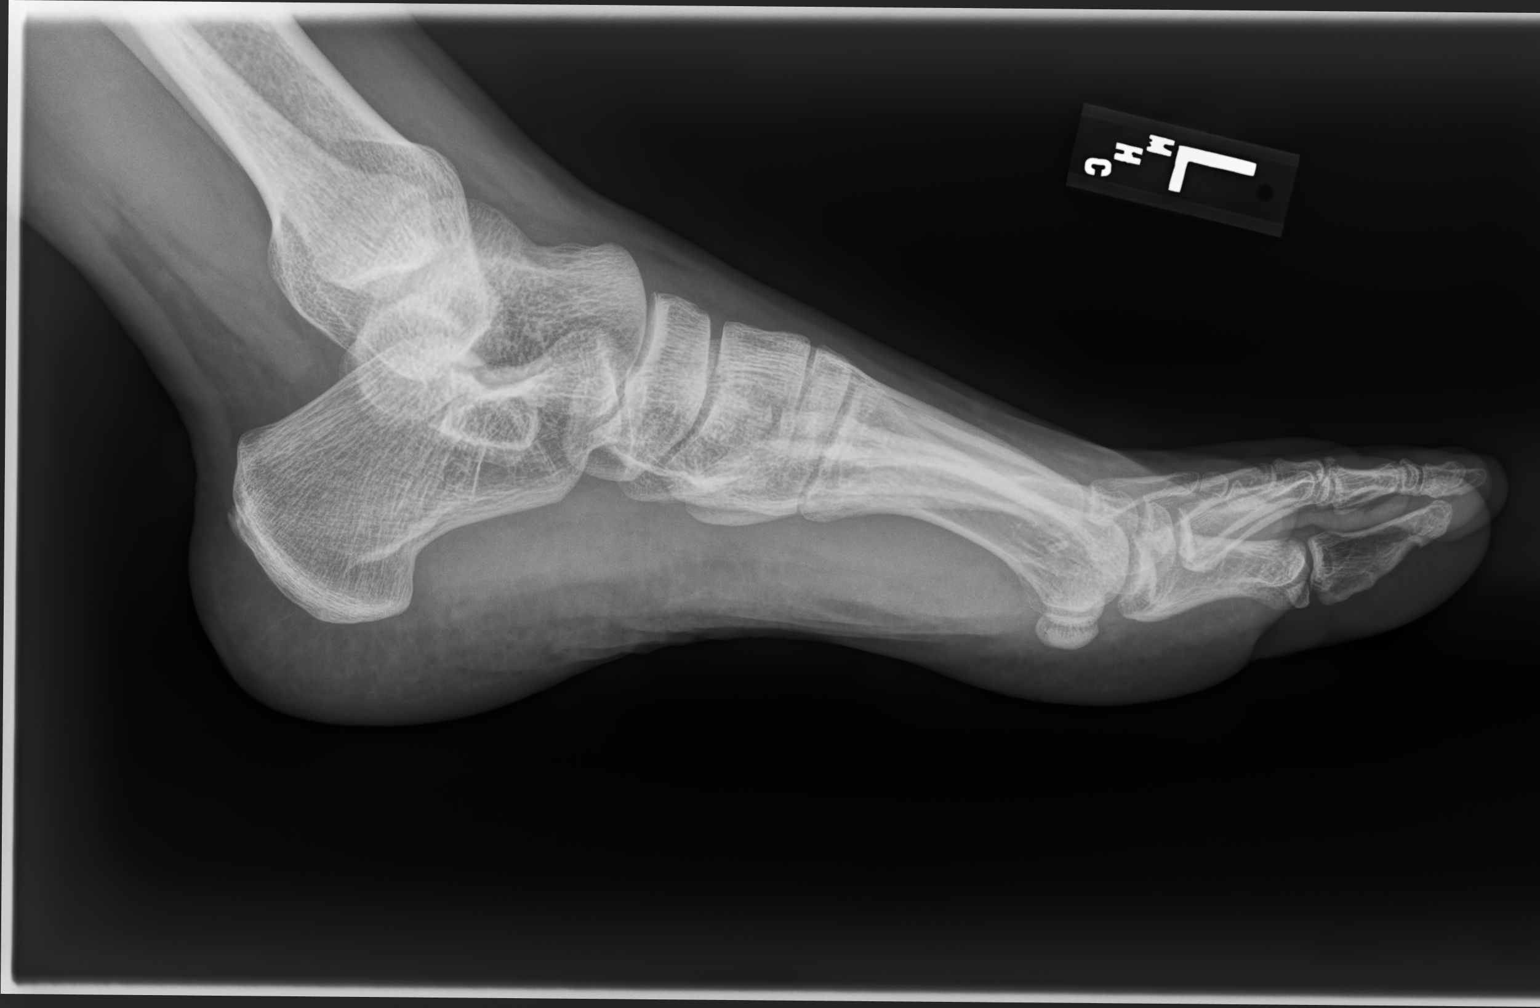

[3 of 3 positions shown; findings below may reference images not displayed]

FINDINGS: No evidence of acute fracture. No dislocation. Joint spaces are
maintained. No significant arthropathy. Mild soft tissue swelling at
the lateral aspect of the distal forefoot.
IMPRESSION: 1. No acute osseous abnormality, left foot.
2. Mild soft tissue swelling at the lateral aspect of the distal
forefoot.

## 2023-12-10 ENCOUNTER — Encounter: Payer: Self-pay | Admitting: Internal Medicine
